# Patient Record
Sex: Male | Born: 1985 | Race: Black or African American | Hispanic: No | Marital: Single | Smoking: Current every day smoker
Health system: Southern US, Community
[De-identification: ages and names within clinical notes are randomized; demographics above are authoritative.]

---
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
## 2006-01-10 ENCOUNTER — Emergency Department (HOSPITAL_COMMUNITY): Admission: EM | Admit: 2006-01-10 | Discharge: 2006-01-10 | Payer: Self-pay | Admitting: Emergency Medicine

## 2006-10-20 ENCOUNTER — Emergency Department (HOSPITAL_COMMUNITY): Admission: EM | Admit: 2006-10-20 | Discharge: 2006-10-20 | Payer: Self-pay | Admitting: Emergency Medicine

## 2006-11-09 ENCOUNTER — Emergency Department (HOSPITAL_COMMUNITY): Admission: EM | Admit: 2006-11-09 | Discharge: 2006-11-09 | Payer: Self-pay | Admitting: Emergency Medicine
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
## 2021-06-29 ENCOUNTER — Encounter (HOSPITAL_BASED_OUTPATIENT_CLINIC_OR_DEPARTMENT_OTHER): Payer: Self-pay | Admitting: *Deleted

## 2021-06-29 ENCOUNTER — Other Ambulatory Visit: Payer: Self-pay

## 2021-06-29 ENCOUNTER — Emergency Department (HOSPITAL_BASED_OUTPATIENT_CLINIC_OR_DEPARTMENT_OTHER)
Admission: EM | Admit: 2021-06-29 | Discharge: 2021-06-29 | Disposition: A | Discharge status: Home / Self Care | Payer: Self-pay | Attending: Emergency Medicine | Admitting: Emergency Medicine

## 2021-06-29 DIAGNOSIS — J029 Acute pharyngitis, unspecified: Secondary | ICD-10-CM | POA: Insufficient documentation

## 2021-06-29 DIAGNOSIS — K122 Cellulitis and abscess of mouth: Secondary | ICD-10-CM | POA: Insufficient documentation

## 2021-06-29 LAB — GROUP A STREP BY PCR: Group A Strep by PCR: NOT DETECTED

## 2021-06-29 MED ORDER — IBUPROFEN 100 MG/5ML PO SUSP
800.0000 mg | Freq: Once | ORAL | Status: AC
  Administered 2021-06-29: 800 mg via ORAL
  Filled 2021-06-29: qty 40

## 2021-06-29 MED ORDER — DEXAMETHASONE 10 MG/ML FOR PEDIATRIC ORAL USE
10.0000 mg | Freq: Once | INTRAMUSCULAR | Status: AC
  Administered 2021-06-29: 10 mg via ORAL
  Filled 2021-06-29: qty 1

## 2021-06-29 MED ORDER — PENICILLIN G BENZATHINE 1200000 UNIT/2ML IM SUSY
1.2000 10*6.[IU] | PREFILLED_SYRINGE | Freq: Once | INTRAMUSCULAR | Status: AC
  Administered 2021-06-29: 1.2 10*6.[IU] via INTRAMUSCULAR
  Filled 2021-06-29: qty 2

## 2021-06-29 MED ORDER — PREDNISONE 20 MG PO TABS: 20.0000 mg | ORAL_TABLET | Freq: Every day | ORAL | 0 refills | Status: AC

## 2021-06-29 MED ORDER — IBUPROFEN 100 MG/5ML PO SUSP: 600.0000 mg | Freq: Four times a day (QID) | ORAL | 0 refills | Status: AC | PRN

## 2021-06-29 NOTE — Discharge Instructions (Addendum)
Medication sent to your pharmacy including steroids and liquid Motrin.   ? ?Please return to emergency department immediately if symptoms worsen in any way including inability to swallow or tolerate secretions ?

## 2021-06-29 NOTE — ED Triage Notes (Signed)
Pt states sore throat and spitting up blood this morning after waking up ?

## 2021-06-29 NOTE — ED Provider Notes (Signed)
?[facility] EMERGENCY DEPT ?Provider Note ? ? ?CSN: [id number] ?Arrival date & time: 06/29/21  0559 ? ?  ? ?History ? ?Chief Complaint  ?Patient presents with  ? Sore Throat  ? ? ?Craig Newton is a 36 y.o. male. ? ?Patient is a 36 year old male presenting for sore throat.  Patient states yesterday he began having a sore throat.  Denies any fevers, chills, nausea, vomiting, myalgias, nasal congestion, or coughing.  States he woke up this morning with blood tinged sputum.  Admits to pain with swallowing.  Able to tolerate secretions.  Able to tolerate food and water.  Denies difficulty breathing or tongue swelling. ? ?The history is provided by the patient. No language interpreter was used.  ?Sore Throat ?Pertinent negatives include no chest pain, no abdominal pain and no shortness of breath.  ? ?  ? ?Home Medications ?Prior to Admission medications   ?Not on File  ?   ? ?Allergies    ?Patient has no known allergies.   ? ?Review of Systems   ?Review of Systems  ?Constitutional:  Negative for chills and fever.  ?HENT:  Positive for sore throat. Negative for congestion, drooling, ear pain, rhinorrhea, sinus pain and trouble swallowing.   ?Eyes:  Negative for visual disturbance.  ?Respiratory:  Negative for cough and shortness of breath.   ?Cardiovascular:  Negative for chest pain and palpitations.  ?Gastrointestinal:  Negative for abdominal pain, nausea and vomiting.  ?Musculoskeletal:  Negative for neck pain and neck stiffness.  ?All other systems reviewed and are negative. ? ?Physical Exam ?Updated Vital Signs ?BP (!) 127/91   Pulse 76   Temp 99.4 ?F (37.4 ?C) (Oral)   SpO2 98%  ?Physical Exam ?Vitals and nursing note reviewed.  ?Constitutional:   ?   General: He is not in acute distress. ?   Appearance: He is well-developed.  ?HENT:  ?   Head: Normocephalic and atraumatic.  ?   Mouth/Throat:  ?   Lips: Pink.  ?   Mouth: Mucous membranes are moist. No oral lesions or angioedema.  ?   Pharynx: Uvula  midline. Pharyngeal swelling, posterior oropharyngeal erythema and uvula swelling present. No oropharyngeal exudate.  ?   Tonsils: No tonsillar exudate or tonsillar abscesses. 4+ on the right. 4+ on the left.  ?Eyes:  ?   Conjunctiva/sclera: Conjunctivae normal.  ?Cardiovascular:  ?   Rate and Rhythm: Normal rate and regular rhythm.  ?   Heart sounds: No murmur heard. ?Pulmonary:  ?   Effort: Pulmonary effort is normal. No respiratory distress.  ?   Breath sounds: Normal breath sounds.  ?Abdominal:  ?   Palpations: Abdomen is soft.  ?   Tenderness: There is no abdominal tenderness.  ?Musculoskeletal:     ?   General: No swelling.  ?   Cervical back: Neck supple.  ?Skin: ?   General: Skin is warm and dry.  ?   Capillary Refill: Capillary refill takes less than 2 seconds.  ?Neurological:  ?   Mental Status: He is alert.  ?Psychiatric:     ?   Mood and Affect: Mood normal.  ? ? ?ED Results / Procedures / Treatments   ?Labs ?(all labs ordered are listed, but only abnormal results are displayed) ?Labs Reviewed  ?GROUP A STREP BY PCR  ? ? ?EKG ?None ? ?Radiology ?No results found. ? ?Procedures ?Procedures  ? ? ?Medications Ordered in ED ?Medications  ?ibuprofen (ADVIL) 100 MG/5ML suspension 800 mg (has no administration  in time range)  ?dexamethasone (DECADRON) 10 MG/ML injection for Pediatric ORAL use 10 mg (has no administration in time range)  ?penicillin g benzathine (BICILLIN LA) 1200000 UNIT/2ML injection 1.2 Million Units (has no administration in time range)  ? ? ?ED Course/ Medical Decision Making/ A&P ?  ?                        ?Medical Decision Making ?Risk ?Prescription drug management. ? ? ?Patient is a 36 year old male presenting for complaints of sore throat.  Patient is alert and oriented x3, no acute distress, afebrile, stable vital signs.  Serious etiologies considered including but not limited to Ludwick's angina, epiglottitis, peritonsillar abscess, angioedema, or retropharyngeal abscess.   ? ?Physical exam demonstrates erythematous oropharynx with uvular swelling and tonsillar swelling. Patient has stable vitals at this time including oxygen saturation of 98%.  No difficulty swallowing.  Drinking water in the room.  No drooling.  No tongue swelling.   ? ?Patient tested for strep pharyngitis however test test to be sent to [facility] for results. Antibiotics, steroids, and medication for pain control given in emergency department. ? ?Symptoms likely secondary to uvulitis versus strep pharyngitis.  Patient recommended to follow his MyChart for strep testing results.  Antibiotics, steroids, and Motrin sent to pharmacy.  ? ?Patient in no distress and overall condition improved here in the ED. Detailed discussions were had with the patient regarding current findings, and need for close f/u with PCP or on call doctor. The patient has been instructed to return immediately if the symptoms worsen in any way for re-evaluation. Patient verbalized understanding and is in agreement with current care plan. All questions answered prior to discharge. ? ? ? ? ? ? ? ?Final Clinical Impression(s) / ED Diagnoses ?Final diagnoses:  ?Uvulitis  ?Pharyngitis, unspecified etiology  ? ? ?Rx / DC Orders ?ED Discharge Orders   ? ? None  ? ?  ? ? ?  ?Franne Forts, DO ?06/30/21 0348 ? ?

## 2022-09-04 ENCOUNTER — Encounter (HOSPITAL_BASED_OUTPATIENT_CLINIC_OR_DEPARTMENT_OTHER): Payer: Self-pay

## 2022-09-04 ENCOUNTER — Emergency Department (HOSPITAL_BASED_OUTPATIENT_CLINIC_OR_DEPARTMENT_OTHER)
Admission: EM | Admit: 2022-09-04 | Discharge: 2022-09-04 | Disposition: A | Discharge status: Home / Self Care | Payer: Self-pay | Attending: Emergency Medicine | Admitting: Emergency Medicine

## 2022-09-04 ENCOUNTER — Other Ambulatory Visit: Payer: Self-pay

## 2022-09-04 DIAGNOSIS — L309 Dermatitis, unspecified: Secondary | ICD-10-CM | POA: Insufficient documentation

## 2022-09-04 DIAGNOSIS — R300 Dysuria: Secondary | ICD-10-CM | POA: Insufficient documentation

## 2022-09-04 LAB — URINALYSIS, ROUTINE W REFLEX MICROSCOPIC
Bilirubin Urine: NEGATIVE
Glucose, UA: NEGATIVE mg/dL
Hgb urine dipstick: NEGATIVE
Leukocytes,Ua: NEGATIVE
Nitrite: NEGATIVE
Specific Gravity, Urine: 1.033 — ABNORMAL HIGH (ref 1.005–1.030)
pH: 6.5 (ref 5.0–8.0)

## 2022-09-04 LAB — CBG MONITORING, ED: Glucose-Capillary: 100 mg/dL — ABNORMAL HIGH (ref 70–99)

## 2022-09-04 MED ORDER — HYDROCORTISONE 1 % EX CREA: TOPICAL_CREAM | CUTANEOUS | 0 refills | Status: AC

## 2022-09-04 MED ORDER — LIDOCAINE HCL (PF) 1 % IJ SOLN
INTRAMUSCULAR | Status: AC
  Filled 2022-09-04: qty 5

## 2022-09-04 MED ORDER — DOXYCYCLINE HYCLATE 100 MG PO TABS
100.0000 mg | ORAL_TABLET | Freq: Once | ORAL | Status: AC
  Administered 2022-09-04: 100 mg via ORAL
  Filled 2022-09-04: qty 1

## 2022-09-04 MED ORDER — CEFTRIAXONE SODIUM 500 MG IJ SOLR
500.0000 mg | Freq: Once | INTRAMUSCULAR | Status: AC
  Administered 2022-09-04: 500 mg via INTRAMUSCULAR
  Filled 2022-09-04: qty 500

## 2022-09-04 MED ORDER — DOXYCYCLINE HYCLATE 100 MG PO CAPS: 100.0000 mg | ORAL_CAPSULE | Freq: Two times a day (BID) | ORAL | 0 refills | Status: AC

## 2022-09-04 NOTE — Discharge Instructions (Addendum)
The results of your testing will be available on the patient portal.  You have been empirically covered for gonorrhea and chlamydia.  Your blood glucose was normal.  Follow-up on the results of your hemoglobin A1c.  Establish care with an outpatient primary care provider.  Hydrocortisone has been prescribed for your rash.
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
## 2022-09-04 NOTE — ED Provider Notes (Signed)
[place] EMERGENCY DEPARTMENT AT [facility] Provider Note   CSN: [id number] Arrival date & time: 09/04/22  1913     History  Chief Complaint  Patient presents with   Exposure to STD    Craig Newton is a 37 y.o. male.   Exposure to STD     37 year old male presenting to the emergency department with dysuria.  The patient states that you had a recent new sexual encounter.  He has had dysuria for the last 2 days.  He denies any testicular pain or swelling.  He denies any abdominal pain.  He additionally requests testing to evaluate for potential diabetes.  He has had some skin changes that he feels are consistent with eczema with some scaly rashes noted.  He consents to STI testing.  He also consents to empiric treatment for STIs.  Home Medications Prior to Admission medications   Medication Sig Start Date End Date Taking? Authorizing Provider  doxycycline (VIBRAMYCIN) 100 MG capsule Take 1 capsule (100 mg total) by mouth 2 (two) times daily for 7 days. 09/04/22 09/11/22 Yes Ernie Avena, MD  hydrocortisone cream 1 % Apply to affected area 2 times daily 09/04/22  Yes Ernie Avena, MD  ibuprofen (ADVIL) 100 MG/5ML suspension Take 30 mLs (600 mg total) by mouth every 6 (six) hours as needed for mild pain or moderate pain. 06/29/21   Franne Forts, DO      Allergies    Patient has no known allergies.    Review of Systems   Review of Systems  All other systems reviewed and are negative.   Physical Exam Updated Vital Signs BP (!) 149/96   Pulse 71   Temp (!) 96.3 F (35.7 C) (Temporal)   Resp 18   Ht 5\' 10"  (1.778 m)   Wt 104.3 kg   SpO2 99%   BMI 33.00 kg/m  Physical Exam Vitals and nursing note reviewed.  Constitutional:      General: He is not in acute distress. HENT:     Head: Normocephalic and atraumatic.  Eyes:     Conjunctiva/sclera: Conjunctivae normal.     Pupils: Pupils are equal, round, and reactive to light.  Cardiovascular:     Rate and  Rhythm: Normal rate and regular rhythm.  Pulmonary:     Effort: Pulmonary effort is normal. No respiratory distress.  Abdominal:     General: There is no distension.     Tenderness: There is no guarding.  Musculoskeletal:        General: No deformity or signs of injury.     Cervical back: Neck supple.  Skin:    Findings: Rash present. No lesion.     Comments: Scaling rash along the patient's right neck, right groin  Neurological:     General: No focal deficit present.     Mental Status: He is alert. Mental status is at baseline.     ED Results / Procedures / Treatments   Labs (all labs ordered are listed, but only abnormal results are displayed) Labs Reviewed  URINALYSIS, ROUTINE W REFLEX MICROSCOPIC - Abnormal; Notable for the following components:      Result Value   Specific Gravity, Urine 1.033 (*)    Ketones, ur TRACE (*)    Protein, ur TRACE (*)    All other components within normal limits  CBG MONITORING, ED - Abnormal; Notable for the following components:   Glucose-Capillary 100 (*)    All other components within normal limits  HIV ANTIBODY (ROUTINE TESTING W REFLEX)  RPR  HEMOGLOBIN A1C  GC/CHLAMYDIA PROBE AMP ([place]) NOT AT [facility]    EKG None  Radiology No results found.  Procedures Procedures    Medications Ordered in ED Medications  lidocaine (PF) (XYLOCAINE) 1 % injection (has no administration in time range)  cefTRIAXone (ROCEPHIN) injection 500 mg (500 mg Intramuscular Given 09/04/22 2049)  doxycycline (VIBRA-TABS) tablet 100 mg (100 mg Oral Given 09/04/22 2048)    ED Course/ Medical Decision Making/ A&P                             Medical Decision Making Amount and/or Complexity of Data Reviewed Labs: ordered.  Risk Prescription drug management.     37 year old male presenting to the emergency department with dysuria.  The patient states that you had a recent new sexual encounter.  He has had dysuria for the last 2 days.  He denies  any testicular pain or swelling.  He denies any abdominal pain.  He additionally requests testing to evaluate for potential diabetes.  He has had some skin changes that he feels are consistent with eczema with some scaly rashes noted.  He consents to STI testing.  He also consents to empiric treatment for STIs.  On arrival, the patient was vitally stable.  Presenting with dysuria with concern for STI.  She was empirically covered with Rocephin and doxycycline.  His urinalysis revealed trace protein, negative nitrates and leukocytes, negative for UTI.  HIV and RPR testing was collected.  GC chlamydia testing was also collected and pending.  The patient requests testing for diabetes.  His blood glucose nonfasting was 100.  Hemoglobin A1c was collected and pending.  He currently does not have a primary care provider.  I provided him resources for outpatient follow-up to discuss the results of his testing.  He was provided prescription for doxycycline for empiric treatment of chlamydia.  He was advised to follow-up on the results of the remainder of his testing on the patient portal.  A prescription for hydrocortisone was provided for his rash. He has no red flag sx, no mucosal involvement, no involvement of the palms and soles, appears to be a dermitis. Overall well-appearing and stable for discharge.   Final Clinical Impression(s) / ED Diagnoses Final diagnoses:  Dysuria  Eczema, unspecified type    Rx / DC Orders ED Discharge Orders          Ordered    doxycycline (VIBRAMYCIN) 100 MG capsule  2 times daily        09/04/22 2108    hydrocortisone cream 1 %        09/04/22 2109              Ernie Avena, MD 09/04/22 2303

## 2022-09-04 NOTE — ED Notes (Signed)
Found standing at nursing station wanting to leave. Asking for DC papers. Does not allow vitals. DC papers reviewed briefly in hall. No concerns.

## 2022-09-05 LAB — RPR: RPR Ser Ql: NONREACTIVE

## 2022-09-05 LAB — HIV ANTIBODY (ROUTINE TESTING W REFLEX): HIV Screen 4th Generation wRfx: NONREACTIVE

## 2022-09-06 LAB — GC/CHLAMYDIA PROBE AMP (~~LOC~~) NOT AT ARMC
Chlamydia: NEGATIVE
Comment: NEGATIVE
Comment: NORMAL
Neisseria Gonorrhea: NEGATIVE

## 2022-09-06 LAB — HEMOGLOBIN A1C
Hgb A1c MFr Bld: 5.8 % — ABNORMAL HIGH (ref 4.8–5.6)
Mean Plasma Glucose: 120 mg/dL

## 2023-10-01 ENCOUNTER — Emergency Department (HOSPITAL_BASED_OUTPATIENT_CLINIC_OR_DEPARTMENT_OTHER)

## 2023-10-01 ENCOUNTER — Emergency Department (HOSPITAL_BASED_OUTPATIENT_CLINIC_OR_DEPARTMENT_OTHER): Admission: EM | Admit: 2023-10-01 | Discharge: 2023-10-01 | Disposition: A | Discharge status: Home / Self Care

## 2023-10-01 ENCOUNTER — Other Ambulatory Visit: Payer: Self-pay

## 2023-10-01 ENCOUNTER — Encounter (HOSPITAL_BASED_OUTPATIENT_CLINIC_OR_DEPARTMENT_OTHER): Payer: Self-pay

## 2023-10-01 DIAGNOSIS — W500XXA Accidental hit or strike by another person, initial encounter: Secondary | ICD-10-CM | POA: Insufficient documentation

## 2023-10-01 DIAGNOSIS — S62336A Displaced fracture of neck of fifth metacarpal bone, right hand, initial encounter for closed fracture: Secondary | ICD-10-CM | POA: Insufficient documentation

## 2023-10-01 MED ORDER — OXYCODONE-ACETAMINOPHEN 5-325 MG PO TABS
1.0000 | ORAL_TABLET | ORAL | Status: DC | PRN
  Administered 2023-10-01: 1 via ORAL
  Filled 2023-10-01: qty 1

## 2023-10-01 MED ORDER — HYDROCODONE-ACETAMINOPHEN 5-325 MG PO TABS
1.0000 | ORAL_TABLET | Freq: Once | ORAL | Status: AC
  Administered 2023-10-01: 1 via ORAL
  Filled 2023-10-01: qty 1

## 2023-10-01 MED ORDER — OXYCODONE-ACETAMINOPHEN 5-325 MG PO TABS: 1.0000 | ORAL_TABLET | Freq: Four times a day (QID) | ORAL | 0 refills | Status: AC | PRN

## 2023-10-01 MED ORDER — AMOXICILLIN-POT CLAVULANATE 875-125 MG PO TABS: 1.0000 | ORAL_TABLET | Freq: Two times a day (BID) | ORAL | 0 refills | Status: AC
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
## 2023-10-01 NOTE — ED Notes (Signed)
 Reviewed AVS/discharge instruction with patient. Time allotted for and all questions answered. Patient is agreeable for d/c and escorted to ed exit by staff.
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
## 2023-10-01 NOTE — Discharge Instructions (Addendum)
 It was a pleasure taking care of you here today.  Keep your splint clean and dry.  Follow-up with a hand surgeon.  There is number listed on your discharge paperwork.  Call tomorrow to schedule an appointment  Follow-up outpatient, return for any worsening symptoms

## 2023-12-10 ENCOUNTER — Ambulatory Visit (HOSPITAL_BASED_OUTPATIENT_CLINIC_OR_DEPARTMENT_OTHER): Admitting: Family Medicine

## 2024-02-11 ENCOUNTER — Ambulatory Visit (HOSPITAL_BASED_OUTPATIENT_CLINIC_OR_DEPARTMENT_OTHER): Admitting: Family Medicine
